# Patient Record
Sex: Female | Born: 2000 | Hispanic: No | Marital: Single | Smoking: Never smoker
Health system: Southern US, Community
[De-identification: ages and names within clinical notes are randomized; demographics above are authoritative.]

## PROBLEM LIST (undated history)

## (undated) DIAGNOSIS — R454 Irritability and anger: Secondary | ICD-10-CM

---
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
## 2022-08-05 ENCOUNTER — Emergency Department (HOSPITAL_COMMUNITY): Payer: Medicaid Other

## 2022-08-05 ENCOUNTER — Emergency Department (HOSPITAL_COMMUNITY)
Admission: EM | Admit: 2022-08-05 | Discharge: 2022-08-05 | Disposition: A | Discharge status: Home / Self Care | Payer: Medicaid Other | Attending: Emergency Medicine | Admitting: Emergency Medicine

## 2022-08-05 ENCOUNTER — Encounter (HOSPITAL_COMMUNITY): Payer: Self-pay | Admitting: Emergency Medicine

## 2022-08-05 ENCOUNTER — Other Ambulatory Visit: Payer: Self-pay

## 2022-08-05 DIAGNOSIS — S060X9A Concussion with loss of consciousness of unspecified duration, initial encounter: Secondary | ICD-10-CM

## 2022-08-05 DIAGNOSIS — S50811A Abrasion of right forearm, initial encounter: Secondary | ICD-10-CM | POA: Diagnosis not present

## 2022-08-05 DIAGNOSIS — Y9229 Other specified public building as the place of occurrence of the external cause: Secondary | ICD-10-CM | POA: Insufficient documentation

## 2022-08-05 DIAGNOSIS — S060XAA Concussion with loss of consciousness status unknown, initial encounter: Secondary | ICD-10-CM | POA: Diagnosis not present

## 2022-08-05 DIAGNOSIS — S0990XA Unspecified injury of head, initial encounter: Secondary | ICD-10-CM | POA: Diagnosis present

## 2022-08-05 DIAGNOSIS — S61411A Laceration without foreign body of right hand, initial encounter: Secondary | ICD-10-CM | POA: Diagnosis not present

## 2022-08-05 DIAGNOSIS — M546 Pain in thoracic spine: Secondary | ICD-10-CM | POA: Insufficient documentation

## 2022-08-05 DIAGNOSIS — M545 Low back pain, unspecified: Secondary | ICD-10-CM | POA: Insufficient documentation

## 2022-08-05 MED ORDER — CEPHALEXIN 500 MG PO CAPS: 500.0000 mg | ORAL_CAPSULE | Freq: Four times a day (QID) | ORAL | 0 refills | Status: AC

## 2022-08-05 MED ORDER — LIDOCAINE HCL (PF) 1 % IJ SOLN
5.0000 mL | Freq: Once | INTRAMUSCULAR | Status: AC
  Administered 2022-08-05: 5 mL
  Filled 2022-08-05: qty 5
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
## 2022-08-05 NOTE — ED Notes (Signed)
Patient verbalizes understanding of discharge instructions. Opportunity for questioning and answers were provided. Armband removed by staff, pt discharged from ED. Pt ambulatory to ED waiting room with steady gait.  

## 2022-08-05 NOTE — Discharge Instructions (Addendum)
Your work-up today was overall reassuring.  X-ray of your hand did not show any fractures.  CT scan of the head did not show any concerning findings.  Given the mechanism of your injury you most likely have a concussion.  I have attached information regarding concussion and what to expect.  Mild nausea, headache is normal.  Significant episodes of throwing up, severe headache, or significant somnolence is concerning.  If you notice any concerning signs please return for evaluation otherwise have given you information for concussion clinic listed above.  Have also sent antibiotic in for the laceration to your right hand.  If you notice any signs of infection such as fever, significant redness or drainage surrounding the wound please return for evaluation.  The stitch will need to be removed in about 7 days.  You could follow-up with your primary care provider, go to an urgent care or return to the emergency room to have this taken out.

## 2023-01-04 ENCOUNTER — Inpatient Hospital Stay (HOSPITAL_COMMUNITY)
Admission: EM | Admit: 2023-01-04 | Discharge: 2023-01-06 | DRG: 641 | Disposition: A | Discharge status: Home / Self Care | Payer: Self-pay | Attending: Internal Medicine | Admitting: Internal Medicine

## 2023-01-04 ENCOUNTER — Other Ambulatory Visit: Payer: Self-pay

## 2023-01-04 ENCOUNTER — Emergency Department (HOSPITAL_COMMUNITY): Payer: Medicaid Other

## 2023-01-04 DIAGNOSIS — E669 Obesity, unspecified: Secondary | ICD-10-CM | POA: Diagnosis present

## 2023-01-04 DIAGNOSIS — M659 Synovitis and tenosynovitis, unspecified: Secondary | ICD-10-CM | POA: Diagnosis present

## 2023-01-04 DIAGNOSIS — F17210 Nicotine dependence, cigarettes, uncomplicated: Secondary | ICD-10-CM | POA: Diagnosis present

## 2023-01-04 DIAGNOSIS — S61251A Open bite of left index finger without damage to nail, initial encounter: Secondary | ICD-10-CM | POA: Diagnosis present

## 2023-01-04 DIAGNOSIS — E876 Hypokalemia: Principal | ICD-10-CM | POA: Diagnosis not present

## 2023-01-04 DIAGNOSIS — S61452A Open bite of left hand, initial encounter: Secondary | ICD-10-CM | POA: Diagnosis present

## 2023-01-04 DIAGNOSIS — S61459A Open bite of unspecified hand, initial encounter: Secondary | ICD-10-CM | POA: Diagnosis present

## 2023-01-04 DIAGNOSIS — Z23 Encounter for immunization: Secondary | ICD-10-CM

## 2023-01-04 DIAGNOSIS — W5501XA Bitten by cat, initial encounter: Secondary | ICD-10-CM | POA: Diagnosis present

## 2023-01-04 DIAGNOSIS — Z6830 Body mass index (BMI) 30.0-30.9, adult: Secondary | ICD-10-CM

## 2023-01-04 DIAGNOSIS — T148XXA Other injury of unspecified body region, initial encounter: Secondary | ICD-10-CM

## 2023-01-04 DIAGNOSIS — T368X5A Adverse effect of other systemic antibiotics, initial encounter: Secondary | ICD-10-CM | POA: Diagnosis present

## 2023-01-04 DIAGNOSIS — L03114 Cellulitis of left upper limb: Secondary | ICD-10-CM | POA: Diagnosis present

## 2023-01-04 LAB — CBC WITH DIFFERENTIAL/PLATELET
Abs Immature Granulocytes: 0.01 10*3/uL (ref 0.00–0.07)
Basophils Absolute: 0.1 10*3/uL (ref 0.0–0.1)
Basophils Relative: 1 %
Eosinophils Absolute: 0 10*3/uL (ref 0.0–0.5)
Eosinophils Relative: 1 %
HCT: 43.7 % (ref 36.0–46.0)
Hemoglobin: 15.3 g/dL — ABNORMAL HIGH (ref 12.0–15.0)
Immature Granulocytes: 0 %
Lymphocytes Relative: 23 %
Lymphs Abs: 1.6 10*3/uL (ref 0.7–4.0)
MCH: 31.4 pg (ref 26.0–34.0)
MCHC: 35 g/dL (ref 30.0–36.0)
MCV: 89.7 fL (ref 80.0–100.0)
Monocytes Absolute: 0.4 10*3/uL (ref 0.1–1.0)
Monocytes Relative: 6 %
Neutro Abs: 5 10*3/uL (ref 1.7–7.7)
Neutrophils Relative %: 69 %
Platelets: 293 10*3/uL (ref 150–400)
RBC: 4.87 MIL/uL (ref 3.87–5.11)
RDW: 12.2 % (ref 11.5–15.5)
WBC: 7.1 10*3/uL (ref 4.0–10.5)
nRBC: 0 % (ref 0.0–0.2)

## 2023-01-04 LAB — BASIC METABOLIC PANEL
Anion gap: 11 (ref 5–15)
BUN: 7 mg/dL (ref 6–20)
CO2: 20 mmol/L — ABNORMAL LOW (ref 22–32)
Calcium: 9.3 mg/dL (ref 8.9–10.3)
Chloride: 106 mmol/L (ref 98–111)
Creatinine, Ser: 0.63 mg/dL (ref 0.44–1.00)
GFR, Estimated: >60 mL/min (ref >60)
Glucose, Bld: 98 mg/dL (ref 70–99)
Potassium: 3.8 mmol/L (ref 3.5–5.1)
Sodium: 137 mmol/L (ref 135–145)

## 2023-01-04 LAB — SEDIMENTATION RATE: Sed Rate: 7 mm/hr (ref 0–22)

## 2023-01-04 MED ORDER — ACETAMINOPHEN 325 MG PO TABS
650.0000 mg | ORAL_TABLET | Freq: Four times a day (QID) | ORAL | Status: DC
  Administered 2023-01-05 – 2023-01-06 (×2): 650 mg via ORAL
  Filled 2023-01-04 (×2): qty 2

## 2023-01-04 MED ORDER — DIPHENHYDRAMINE HCL 50 MG/ML IJ SOLN
25.0000 mg | Freq: Once | INTRAMUSCULAR | Status: AC
  Administered 2023-01-04: 25 mg via INTRAVENOUS
  Filled 2023-01-04: qty 1

## 2023-01-04 MED ORDER — MORPHINE SULFATE (PF) 4 MG/ML IV SOLN
4.0000 mg | Freq: Once | INTRAVENOUS | Status: AC
  Administered 2023-01-04: 4 mg via INTRAVENOUS
  Filled 2023-01-04: qty 1

## 2023-01-04 MED ORDER — VANCOMYCIN HCL 750 MG/150ML IV SOLN: 750.0000 mg | Freq: Two times a day (BID) | INTRAVENOUS | Status: DC

## 2023-01-04 MED ORDER — VANCOMYCIN HCL 1250 MG/250ML IV SOLN
1250.0000 mg | Freq: Once | INTRAVENOUS | Status: AC
  Administered 2023-01-04: 1250 mg via INTRAVENOUS
  Filled 2023-01-04: qty 250

## 2023-01-04 MED ORDER — LIDOCAINE HCL (PF) 1 % IJ SOLN
10.0000 mL | Freq: Once | INTRAMUSCULAR | Status: AC
  Administered 2023-01-04: 10 mL
  Filled 2023-01-04: qty 10

## 2023-01-04 MED ORDER — TETANUS-DIPHTH-ACELL PERTUSSIS 5-2.5-18.5 LF-MCG/0.5 IM SUSY
0.5000 mL | PREFILLED_SYRINGE | Freq: Once | INTRAMUSCULAR | Status: AC
  Administered 2023-01-04: 0.5 mL via INTRAMUSCULAR
  Filled 2023-01-04: qty 0.5

## 2023-01-04 MED ORDER — MORPHINE SULFATE (PF) 2 MG/ML IV SOLN: 2.0000 mg | INTRAVENOUS | Status: DC | PRN

## 2023-01-04 MED ORDER — ONDANSETRON HCL 4 MG/2ML IJ SOLN: 4.0000 mg | Freq: Four times a day (QID) | INTRAMUSCULAR | Status: DC | PRN

## 2023-01-04 MED ORDER — ONDANSETRON HCL 4 MG/2ML IJ SOLN
4.0000 mg | Freq: Once | INTRAMUSCULAR | Status: AC
  Administered 2023-01-04: 4 mg via INTRAVENOUS
  Filled 2023-01-04: qty 2

## 2023-01-04 MED ORDER — HYDROCODONE-ACETAMINOPHEN 5-325 MG PO TABS: 2.0000 | ORAL_TABLET | Freq: Once | ORAL | Status: DC

## 2023-01-04 MED ORDER — SODIUM CHLORIDE 0.9 % IV SOLN
3.0000 g | Freq: Four times a day (QID) | INTRAVENOUS | Status: DC
  Administered 2023-01-05 – 2023-01-06 (×5): 3 g via INTRAVENOUS
  Filled 2023-01-04 (×5): qty 8

## 2023-01-04 MED ORDER — HYDROCODONE-ACETAMINOPHEN 5-325 MG PO TABS
1.0000 | ORAL_TABLET | ORAL | Status: DC | PRN
  Administered 2023-01-04: 2 via ORAL
  Administered 2023-01-05 (×2): 1 via ORAL
  Filled 2023-01-04 (×2): qty 1
  Filled 2023-01-04: qty 2
  Filled 2023-01-04: qty 1

## 2023-01-04 MED ORDER — IBUPROFEN 200 MG PO TABS
600.0000 mg | ORAL_TABLET | Freq: Four times a day (QID) | ORAL | Status: DC
  Administered 2023-01-05 – 2023-01-06 (×2): 600 mg via ORAL
  Filled 2023-01-04 (×2): qty 3

## 2023-01-04 MED ORDER — ONDANSETRON HCL 4 MG PO TABS: 4.0000 mg | ORAL_TABLET | Freq: Four times a day (QID) | ORAL | Status: DC | PRN

## 2023-01-04 MED ORDER — PIPERACILLIN-TAZOBACTAM 3.375 G IVPB 30 MIN
3.3750 g | Freq: Once | INTRAVENOUS | Status: AC
  Administered 2023-01-04: 3.375 g via INTRAVENOUS
  Filled 2023-01-04: qty 50

## 2023-01-04 MED ORDER — ONDANSETRON 4 MG PO TBDP
8.0000 mg | ORAL_TABLET | Freq: Once | ORAL | Status: DC
  Filled 2023-01-04: qty 2

## 2023-01-04 MED ORDER — PIPERACILLIN-TAZOBACTAM 3.375 G IVPB: 3.3750 g | Freq: Three times a day (TID) | INTRAVENOUS | Status: DC

## 2023-01-04 NOTE — H&P (Signed)
History and Physical    Patient: Lindsay Hickman WUJ:[id number] DOB: 05/19/2001 DOA: 01/04/2023 DOS: the patient was seen and examined on 01/04/2023 PCP: Patient, No Pcp Per  Patient coming from: Home - lives with her partner and brother.    Chief Complaint: cat bite   HPI: Lindsay Hickman is a 22 y.o. female with no significant medical history presenting with cat bite. She got bit by her cat last night. She cleaned it out and put  neosporin on it. She states she had a hard time sleeping due to the discomfort.  This morning when she woke up it was slightly swollen and she couldn't really open or close her hand, but went to work.  As the day progressed she had more swelling, pain and increasing redness. She also had some pus like drainage. She states the pain was so bad she came to ED.   These are her cats. The cat was 9 months, has not had rabies. Never has left the house. She is unsure about tdap, not any history in chart.    Denies any fever/chills, vision changes/headaches, chest pain or palpitations, shortness of breath or cough, abdominal pain, N/V/D, dysuria or leg swelling.    She does smoke black and mild occasionally and does not drink alcohol.   ER Course:  vitals: afebrile, bp: 139/98, HR; 85, RR: 17, oxygen: 95%RA Pertinent labs: none Left hand xray: no fracture or radiopaque foreign body  In ED: ortho consulted. Asked EDP to do I&D at bedside and will re-evaluate tomorrow. TRH to admit.    Review of Systems: As mentioned in the history of present illness. All other systems reviewed and are negative. Past Medical History:  Diagnosis Date   Outbursts of anger    No past surgical history on file. Social History:  reports that she has never smoked. She does not have any smokeless tobacco history on file. She reports current drug use. Drug: Marijuana. She reports that she does not drink alcohol.  No Known Allergies  No family history on  file.  Prior to Admission medications   Medication Sig Start Date End Date Taking? Authorizing Provider  ibuprofen (ADVIL,MOTRIN) 200 MG tablet Take 200-400 mg by mouth every 4 (four) hours as needed for fever.    [provider]    Physical Exam: Vitals:   01/04/23 1825 01/04/23 1945 01/04/23 2100 01/04/23 2246  BP: (!) 128/96 108/65 114/67   Pulse: 87 80 65   Resp: 17  17   Temp: 98 F (36.7 C)   98.4 F (36.9 C)  TempSrc:    Oral  SpO2: 100% 100% 100%    General:  Appears calm and comfortable and is in NAD Eyes:  PERRL, EOMI, normal lids, iris ENT:  grossly normal hearing, lips & tongue, mmm; appropriate dentition Neck:  no LAD, masses or thyromegaly; no carotid bruits Cardiovascular:  RRR, no m/r/g. No LE edema.  Respiratory:   CTA bilaterally with no wheezes/rales/rhonchi.  Normal respiratory effort. Abdomen:  soft, NT, ND, NABS Back:   normal alignment, no CVAT Skin:  no rash or induration seen on limited exam Musculoskeletal:  grossly normal tone BUE/BLE, good ROM, no bony abnormality. Left hand: puncture wound to 2nd digit with surrounding erythema and edema. Can not close hand. +flexion/extension against resistance of finger.  Can fully extend finger. More pain to palpation in thumb. No fluctuance. Radial pulse intact. Clear drainage from puncture wounds, but nothing expressed, no fluctuance.  Lower  extremity:  No LE edema.  Limited foot exam with no ulcerations.  2+ distal pulses. Psychiatric:  grossly normal mood and affect, speech fluent and appropriate, AOx3 Neurologic:  CN 2-12 grossly intact, moves all extremities in coordinated fashion, sensation intact   Radiological Exams on Admission: Independently reviewed - see discussion in A/P where applicable  DG Hand Complete Left  Result Date: 01/04/2023 CLINICAL DATA:  Animal bite EXAM: LEFT HAND - COMPLETE 3+ VIEW COMPARISON:  None Available. FINDINGS: Frontal, oblique, and lateral views of the left hand are  obtained. No fracture, subluxation, or dislocation. Joint spaces are well preserved. Soft tissues are unremarkable. No radiopaque foreign body. IMPRESSION: 1. No fracture or radiopaque foreign body. Electronically Signed   By: Randa Ngo M.D.   On: 01/04/2023 19:57     Labs on Admission: I have personally reviewed the available labs and imaging studies at the time of the admission.  Pertinent labs:   None   Assessment and Plan: Principal Problem:   Cat bite of left hand    Assessment and Plan: * Cat bite of left hand 29 year old right handed patient with history of cat bite to left 2nd digit with worsening swelling, pain and redness -obs to med-surg -hand surgery consulted who recommended medical management for now and will re assess in the morning for possible early abscess formation that may require OR.  -I&D done by EdP per ortho request  -no leukocytosis/sepsis criteria. Normal ESR  -NPO at midnight -was on vanc/zosyn in ED and had reaction to vancomycin. Resolved with benadryl. With cat bite will change to unasyn -pain medication with norco and morphine  -tdap given -cat is 9 months and has never left the house, but does not have rabies vaccine. Discussed this with patient and need to quarantine cat. Will need to f/u on this and make sure it is done.      Advance Care Planning:   Code Status: Full Code   Consults: hand surgery: Dr. Grandville Silos   DVT Prophylaxis: ambulation   Family Communication: none   Severity of Illness: The appropriate patient status for this patient is OBSERVATION. Observation status is judged to be reasonable and necessary in order to provide the required intensity of service to ensure the patient's safety. The patient's presenting symptoms, physical exam findings, and initial radiographic and laboratory data in the context of their medical condition is felt to place them at decreased risk for further clinical deterioration. Furthermore, it is  anticipated that the patient will be medically stable for discharge from the hospital within 2 midnights of admission.   Author: Orma Flaming, MD 01/04/2023 11:12 PM  For on call review [email or web address].

## 2023-01-04 NOTE — Progress Notes (Signed)
Contacted regarding this patient with developing finger infection following cat bite.  Reported to be afebrile, no WBC yet available.  Verbalized concern was for suppurative flexor tenosynovitis.  Recommended that due to verbalized concern, patient should be admitted for treatment for parenteral antibiotics and observation for response to treatment.  Please keep NPO and avoid pharmacological anticoagulation until fully determined  not to require invasive treatment.  I will plan to evaluate her to help determine if supplemental surgical drainage will be helpful as an adjunct to her definitive medical treatment.

## 2023-01-04 NOTE — ED Notes (Signed)
ED TO INPATIENT HANDOFF REPORT  ED Nurse Name and Phone #: [phone]  S Name/Age/Gender Lindsay Hickman 22 y.o. female Room/Bed: 035C/035C  Code Status   Code Status: Prior  Home/SNF/Other Home Patient oriented to: self, place, time, and situation Is this baseline? Yes   Triage Complete: Triage complete  Chief Complaint Cat bite of left hand [S61.452A, W55.01XA]  Triage Note Patient here for evaluation of bilateral hand injuries after she was bitten by her girlfriend's cat yesterday which she reports she believes is up to date on vaccinations. Patient complains of painful swelling on left hand.    Allergies No Known Allergies  Level of Care/Admitting Diagnosis ED Disposition     ED Disposition  Admit   Condition  --   Comment  Hospital Area: [facility] [[id number]]  Level of Care: Med-Surg [16]  May place patient in observation at [facility] or [facility] if equivalent level of care is available:: No  Covid Evaluation: Asymptomatic - no recent exposure (last 10 days) testing not required  Diagnosis: Cat bite of left hand [[id number]]  Admitting Physician: Orma Flaming [[id number]]  Attending Physician: Orma Flaming [[id number]]          B Medical/Surgery History Past Medical History:  Diagnosis Date   Outbursts of anger    No past surgical history on file.   A IV Location/Drains/Wounds Patient Lines/Drains/Airways Status     Active Line/Drains/Airways     Name Placement date Placement time Site Days   Peripheral IV 01/04/23 20 G Right Antecubital 01/04/23  2003  Antecubital  less than 1            Intake/Output Last 24 hours  Intake/Output Summary (Last 24 hours) at 01/04/2023 2244 Last data filed at 01/04/2023 2143 Gross per 24 hour  Intake 258.67 ml  Output --  Net 258.67 ml    Labs/Imaging Results for orders placed or performed during the hospital encounter of 01/04/23 (from the past 48 hour(s))  CBC  with Differential     Status: Abnormal   Collection Time: 01/04/23  7:57 PM  Result Value Ref Range   WBC 7.1 4.0 - 10.5 K/uL   RBC 4.87 3.87 - 5.11 MIL/uL   Hemoglobin 15.3 (H) 12.0 - 15.0 g/dL   HCT 43.7 36.0 - 46.0 %   MCV 89.7 80.0 - 100.0 fL   MCH 31.4 26.0 - 34.0 pg   MCHC 35.0 30.0 - 36.0 g/dL   RDW 12.2 11.5 - 15.5 %   Platelets 293 150 - 400 K/uL   nRBC 0.0 0.0 - 0.2 %   Neutrophils Relative % 69 %   Neutro Abs 5.0 1.7 - 7.7 K/uL   Lymphocytes Relative 23 %   Lymphs Abs 1.6 0.7 - 4.0 K/uL   Monocytes Relative 6 %   Monocytes Absolute 0.4 0.1 - 1.0 K/uL   Eosinophils Relative 1 %   Eosinophils Absolute 0.0 0.0 - 0.5 K/uL   Basophils Relative 1 %   Basophils Absolute 0.1 0.0 - 0.1 K/uL   Immature Granulocytes 0 %   Abs Immature Granulocytes 0.01 0.00 - 0.07 K/uL    Comment: Performed at [facility], [address], [place], [place] [address]  Basic metabolic panel     Status: Abnormal   Collection Time: 01/04/23  7:57 PM  Result Value Ref Range   Sodium 137 135 - 145 mmol/L   Potassium 3.8 3.5 - 5.1 mmol/L   Chloride 106 98 -  111 mmol/L   CO2 20 (L) 22 - 32 mmol/L   Glucose, Bld 98 70 - 99 mg/dL    Comment: Glucose reference range applies only to samples taken after fasting for at least 8 hours.   BUN 7 6 - 20 mg/dL   Creatinine, Ser 0.63 0.44 - 1.00 mg/dL   Calcium 9.3 8.9 - 10.3 mg/dL   GFR, Estimated >60 >60 mL/min    Comment: (NOTE) Calculated using the CKD-EPI Creatinine Equation (2021)    Anion gap 11 5 - 15    Comment: Performed at [facility] [address] [place], [place] [address]  Sedimentation rate     Status: None   Collection Time: 01/04/23  7:57 PM  Result Value Ref Range   Sed Rate 7 0 - 22 mm/hr    Comment: Performed at [facility], [place] [address], [place], [place] [address]   DG Hand Complete Left  Result Date: 01/04/2023 CLINICAL DATA:  Animal bite EXAM: LEFT HAND - COMPLETE 3+ VIEW COMPARISON:  None Available.  FINDINGS: Frontal, oblique, and lateral views of the left hand are obtained. No fracture, subluxation, or dislocation. Joint spaces are well preserved. Soft tissues are unremarkable. No radiopaque foreign body. IMPRESSION: 1. No fracture or radiopaque foreign body. Electronically Signed   By: Randa Ngo M.D.   On: 01/04/2023 19:57    Pending Labs Unresulted Labs (From admission, onward)     Start     Ordered   01/04/23 2232  Rapid urine drug screen (hospital performed)  ONCE - STAT,   STAT        01/04/23 2231            Vitals/Pain Today's Vitals   01/04/23 1744 01/04/23 1825 01/04/23 1945 01/04/23 2100  BP:  (!) 128/96 108/65 114/67  Pulse:  87 80 65  Resp:  17  17  Temp:  98 F (36.7 C)    SpO2:  100% 100% 100%  PainSc: 10-Worst pain ever       Isolation Precautions No active isolations  Medications Medications  ibuprofen (ADVIL) tablet 600 mg (has no administration in time range)  acetaminophen (TYLENOL) tablet 650 mg (has no administration in time range)  Ampicillin-Sulbactam (UNASYN) 3 g in sodium chloride 0.9 % 100 mL IVPB (has no administration in time range)  piperacillin-tazobactam (ZOSYN) IVPB 3.375 g (0 g Intravenous Stopped 01/04/23 2025)  vancomycin (VANCOREADY) IVPB 1250 mg/250 mL (0 mg Intravenous Stopped 01/04/23 2143)  ondansetron (ZOFRAN) injection 4 mg (4 mg Intravenous Given 01/04/23 2004)  morphine (PF) 4 MG/ML injection 4 mg (4 mg Intravenous Given 01/04/23 2003)  lidocaine (PF) (XYLOCAINE) 1 % injection 10 mL (10 mLs Infiltration Given by Other 01/04/23 2206)  diphenhydrAMINE (BENADRYL) injection 25 mg (25 mg Intravenous Given 01/04/23 2150)    Mobility walks     Focused Assessments cardiac   R Recommendations: See Admitting Provider Note  Report given to:   Additional Notes: girlfriend with patient

## 2023-01-04 NOTE — ED Provider Notes (Cosign Needed Addendum)
[facility] Provider Note   CSN: [id number] Arrival date & time: 01/04/23  1713     History  Chief Complaint  Patient presents with   Animal Bite    Lindsay Hickman is a 22 y.o. female.  22 year old female presents today for evaluation following cat bite that occurred yesterday.  She has 2 cat bites over the left index finger, multiple scratches to the right forearm.  She states this morning she woke up with significant swelling, redness to her left index finger that progressively worsened throughout the day.  Patient is right-hand dominant.  Denies fever.  She states her cats broke out into a fight that she tried to break up.  After she broke up the fight the 8-month old cat attacked the patient.  According the patient the cat was up-to-date on its vaccinations.  Patient's partner is at bedside.  She corroborates her story.  Patient recently had a visit for lack repair.  At that visit patient stated her tetanus was up-to-date.  However today when attempting to look through her chart for the date of her last tetanus shot as she states she does not recall when her last tetanus was I was unable to locate this.  Ultimately this was ordered by the hospitalist.    The history is provided by the patient. No language interpreter was used.       Home Medications Prior to Admission medications   Medication Sig Start Date End Date Taking? Authorizing Provider  ibuprofen (ADVIL,MOTRIN) 200 MG tablet Take 200-400 mg by mouth every 4 (four) hours as needed for fever.    [provider]      Allergies    Patient has no known allergies.    Review of Systems   Review of Systems  Constitutional:  Negative for fever.  Musculoskeletal:  Positive for arthralgias.  All other systems reviewed and are negative.   Physical Exam Updated Vital Signs BP (!) 128/96   Pulse 87   Temp 98 F (36.7 C)   Resp 17   SpO2 100%   Physical Exam Vitals and nursing note reviewed.  Constitutional:      General: She is not in acute distress.    Appearance: Normal appearance. She is not ill-appearing.  HENT:     Head: Normocephalic and atraumatic.     Nose: Nose normal.  Eyes:     General: No scleral icterus.    Extraocular Movements: Extraocular movements intact.     Conjunctiva/sclera: Conjunctivae normal.  Cardiovascular:     Rate and Rhythm: Normal rate and regular rhythm.     Pulses: Normal pulses.  Pulmonary:     Effort: Pulmonary effort is normal. No respiratory distress.     Breath sounds: Normal breath sounds. No wheezing or rales.  Abdominal:     General: There is no distension.     Palpations: Abdomen is soft.     Tenderness: There is no abdominal tenderness.  Musculoskeletal:        General: Normal range of motion.     Cervical back: Normal range of motion.     Comments: Multiple bite marks noted to left index finger.  Surrounded by erythema and swelling.  Patient is keeping the left index finger in flexion.  Unable to extend.  Pain with passive extension.  Tenderness to palpation present along the flexor tendon sheath.  Neurovascularly intact.  Brisk cap refill.  Skin:    General: Skin  is warm and dry.     Comments: 2 bite bite mark noted to left index finger.  1 on the dorsal aspect the other on the palmar aspect.  Scratches noted to right forearm.  Neurological:     General: No focal deficit present.     Mental Status: She is alert. Mental status is at baseline.       ED Results / Procedures / Treatments   Labs (all labs ordered are listed, but only abnormal results are displayed) Labs Reviewed - No data to display  EKG None  Radiology No results found.  Procedures .Nerve Block  Date/Time: 01/05/2023 12:01 AM  Performed by: Evlyn Courier, PA-C Authorized by: Evlyn Courier, PA-C   Consent:    Consent obtained:  Verbal   Consent given by:  Patient   Risks discussed:  Swelling, pain,  nerve damage, unsuccessful block and infection   Alternatives discussed:  No treatment, delayed treatment and alternative treatment Universal protocol:    Procedure explained and questions answered to patient or proxy's satisfaction: yes     Relevant documents present and verified: yes     Test results available: yes     Imaging studies available: yes     Patient identity confirmed:  Verbally with patient and arm band Indications:    Indications:  Pain relief and procedural anesthesia Location:    Body area:  Upper extremity   Upper extremity nerve:  Metacarpal   Laterality:  Left Pre-procedure details:    Skin preparation:  Alcohol Skin anesthesia:    Skin anesthesia method:  None Procedure details:    Block needle gauge:  27 G Post-procedure details:    Outcome:  Anesthesia achieved   Procedure completion:  Tolerated well, no immediate complications     Medications Ordered in ED Medications - No data to display  ED Course/ Medical Decision Making/ A&P Clinical Course as of 01/04/23 2128  Fri Jan 04, 2023  1830 Evaluated at bedside.  Bit by cat yesterday.  Now cannot bend finger first digit left hand.  No other medical problems.  Cannot tolerate passive extension held in slight flexion, swelling over volar surface [CC]  1951 CBC with Differential [AA]    Clinical Course User Index [AA] Evlyn Courier, PA-C [CC] Tretha Sciara, MD                             Medical Decision Making Amount and/or Complexity of Data Reviewed Labs: ordered. Decision-making details documented in ED Course. Radiology: ordered.  Risk Prescription drug management. Decision regarding hospitalization.   Medical Decision Making / ED Course   This patient presents to the ED for concern of animal bite of the hand, this involves an extensive number of treatment options, and is a complaint that carries with it a high risk of complications and morbidity.  The differential diagnosis includes  cellulitis, flexor tenosynovitis  MDM: 22 year old female presents today for evaluation following a cat bite that occurred yesterday.  Since then she has had worsening redness, swelling, and pain.  Positive [place] signs on exam.  Images placed under media tab.  Antibiotics ordered, pain medication ordered.  Discussed with hand surgeon who will evaluate patient.  However per the images above he does not feel patient needs to be taken the OR tonight.  He recommends IV antibiotics and admission to medicine.  Following evaluated patient at bedside and surgery recommends bedside incision and drainage surrounding the bite  marks.  These were done by my attending.  Please see his procedure note regarding this.  Case discussed with hospitalist who will evaluate patient for admission. Patient states the cat is at her partner's apartment.  Discussion had regarding rabies vaccinations as patient's cat is 14 months old and is not up-to-date on vaccinations.  However this was a mild spreading of their previous cat and has been domesticated since then.  No suspicion for rabies exposure.  Shared decision making had regarding continuing with rabies vaccinations versus quarantining the cat and patient prefers to quarantine the cat with animal control. Patient is right-hand dominant.  Lab Tests: -I ordered, reviewed, and interpreted labs.   The pertinent results include:   Labs Reviewed  CBC WITH DIFFERENTIAL/PLATELET - Abnormal; Notable for the following components:      Result Value   Hemoglobin 15.3 (*)    All other components within normal limits  BASIC METABOLIC PANEL - Abnormal; Notable for the following components:   CO2 20 (*)    All other components within normal limits  SEDIMENTATION RATE  RAPID URINE DRUG SCREEN, HOSP PERFORMED      EKG  EKG Interpretation  Date/Time:    Ventricular Rate:    PR Interval:    QRS Duration:   QT Interval:    QTC Calculation:   R Axis:     Text  Interpretation:           Imaging Studies ordered: I ordered imaging studies including left hand x-ray I independently visualized and interpreted imaging. I agree with the radiologist interpretation   Medicines ordered and prescription drug management: Meds ordered this encounter  Medications   DISCONTD: HYDROcodone-acetaminophen (NORCO/VICODIN) 5-325 MG per tablet 2 tablet   DISCONTD: ondansetron (ZOFRAN-ODT) disintegrating tablet 8 mg   piperacillin-tazobactam (ZOSYN) IVPB 3.375 g    Order Specific Question:   Antibiotic Indication:    Answer:   Other Indication (list below)    Order Specific Question:   Other Indication:    Answer:   tenosynovitis   vancomycin (VANCOREADY) IVPB 1250 mg/250 mL    Order Specific Question:   Indication:    Answer:   Other Indication (list below)    Order Specific Question:   Other Indication:    Answer:   tenosynovitis   ondansetron (ZOFRAN) injection 4 mg   morphine (PF) 4 MG/ML injection 4 mg   DISCONTD: vancomycin (VANCOREADY) IVPB 750 mg/150 mL    Order Specific Question:   Indication:    Answer:   Other Indication (list below)    Order Specific Question:   Other Indication:    Answer:   tenosynovitis   DISCONTD: piperacillin-tazobactam (ZOSYN) IVPB 3.375 g    Order Specific Question:   Antibiotic Indication:    Answer:   Other Indication (list below)    Order Specific Question:   Other Indication:    Answer:   tenosynovitis   lidocaine (PF) (XYLOCAINE) 1 % injection 10 mL   ibuprofen (ADVIL) tablet 600 mg   acetaminophen (TYLENOL) tablet 650 mg   diphenhydrAMINE (BENADRYL) injection 25 mg   Ampicillin-Sulbactam (UNASYN) 3 g in sodium chloride 0.9 % 100 mL IVPB    Order Specific Question:   Antibiotic Indication:    Answer:   Wound Infection    -I have reviewed the patients home medicines and have made adjustments as needed  Critical interventions Pain control. IV abx  Consultations Obtained: I requested consultation with the  hand surgery (Dr. Grandville Silos),  and  discussed lab and imaging findings as well as pertinent plan - they recommend: as above  Reevaluation: After the interventions noted above, I reevaluated the patient and found that they have :stayed the same  Co morbidities that complicate the patient evaluation  Past Medical History:  Diagnosis Date   Outbursts of anger       Dispostion: Patient discussed with hospitalist will evaluate patient for admission.  Final Clinical Impression(s) / ED Diagnoses Final diagnoses:  Animal bite  Flexor tenosynovitis of finger    Rx / DC Orders ED Discharge Orders     None         Marita Kansas, PA-C 01/04/23 2312    Marita Kansas, PA-C 01/05/23 0002    Glyn Ade, MD 01/05/23 [phone]

## 2023-01-04 NOTE — Assessment & Plan Note (Addendum)
22 year old right handed patient with history of cat bite to left 2nd digit with worsening swelling, pain and redness -obs to med-surg -hand surgery consulted who recommended medical management for now and will re assess in the morning for possible early abscess formation that may require OR.  -I&D done by EdP per ortho request  -no leukocytosis/sepsis criteria. Normal ESR  -NPO at midnight -was on vanc/zosyn in ED and had reaction to vancomycin. Resolved with benadryl. With cat bite will change to unasyn -pain medication with norco and morphine  -tdap given -cat is 9 months and has never left the house, but does not have rabies vaccine. Discussed this with patient and need to quarantine cat. Will need to f/u on this and make sure it is done.

## 2023-01-04 NOTE — ED Notes (Signed)
Called into room for patient having reaction to vancomycin. Patient has some redness and itching. No trouble breathing. Vancomycin stopped, [place], MD notified and benadryl ordered and given at this time. Patient in NAD.

## 2023-01-04 NOTE — ED Triage Notes (Signed)
Patient here for evaluation of bilateral hand injuries after she was bitten by her girlfriend's cat yesterday which she reports she believes is up to date on vaccinations. Patient complains of painful swelling on left hand.

## 2023-01-04 NOTE — Progress Notes (Signed)
Pharmacy Antibiotic Note  Lindsay Hickman is a 22 y.o. female admitted on 01/04/2023 presenting with infected cat bite, concern for tenosynovitis.  Pharmacy has been consulted for vancomycin and zosyn dosing.  Plan: Vancomycin 1250 mg IV x 1, then 750 mg IV q 12h (eAUC 448) Zosyn 3.375g IV every 8 hours (extended 4h infusion) Monitor renal function, Cx, ortho recs and clinical progression Vancomycin levels as indicated     Temp (24hrs), Avg:98.1 F (36.7 C), Min:98 F (36.7 C), Max:98.2 F (36.8 C)  Recent Labs  Lab 01/04/23 1957  WBC 7.1  CREATININE 0.63    CrCl cannot be calculated (Unknown ideal weight.).    No Known Allergies  Bertis Ruddy, PharmD, [facility] Clinical Pharmacist ED Pharmacist Phone # [phone] 01/04/2023 9:06 PM

## 2023-01-04 NOTE — ED Notes (Signed)
Patient taken to inpatient room with ED tech in NAD at time of transfer.

## 2023-01-04 NOTE — Consult Note (Addendum)
ORTHOPAEDIC CONSULTATION HISTORY & PHYSICAL REQUESTING PHYSICIAN: Tretha Sciara, MD  Chief Complaint: Left index finger cat bite wound  HPI: Lindsay Hickman is a 22 y.o. female who reports being bitten about the proximal phalanx of the left index finger last night.  She reports that it hurt quite a bit when it happened and required special positioning through the night to allow for sleep.  Past Medical History:  Diagnosis Date   Outbursts of anger    No past surgical history on file. Social History   Socioeconomic History   Marital status: Single    Spouse name: Not on file   Number of children: Not on file   Years of education: Not on file   Highest education level: Not on file  Occupational History   Not on file  Tobacco Use   Smoking status: Never   Smokeless tobacco: Not on file  Substance and Sexual Activity   Alcohol use: No   Drug use: Yes    Types: Marijuana   Sexual activity: Not on file  Other Topics Concern   Not on file  Social History Narrative   Not on file   Social Determinants of Health   Financial Resource Strain: Not on file  Food Insecurity: Not on file  Transportation Needs: Not on file  Physical Activity: Not on file  Stress: Not on file  Social Connections: Not on file   No family history on file. No Known Allergies Prior to Admission medications   Medication Sig Start Date End Date Taking? Authorizing Provider  ibuprofen (ADVIL,MOTRIN) 200 MG tablet Take 200-400 mg by mouth every 4 (four) hours as needed for fever.    [provider]   DG Hand Complete Left  Result Date: 01/04/2023 CLINICAL DATA:  Animal bite EXAM: LEFT HAND - COMPLETE 3+ VIEW COMPARISON:  None Available. FINDINGS: Frontal, oblique, and lateral views of the left hand are obtained. No fracture, subluxation, or dislocation. Joint spaces are well preserved. Soft tissues are unremarkable. No radiopaque foreign body. IMPRESSION: 1. No fracture or  radiopaque foreign body. Electronically Signed   By: Randa Ngo M.D.   On: 01/04/2023 19:57    Positive ROS: All other systems have been reviewed and were otherwise negative with the exception of those mentioned in the HPI and as above.  Physical Exam: Vitals: Refer to EMR.  Temp 98.2 Constitutional:  WD, WN, NAD HEENT:  NCAT, EOMI Neuro/Psych:  Alert & oriented to person, place, and time; appropriate mood & affect Lymphatic: No generalized extremity edema or lymphadenopathy Extremities / MSK:  The extremities are normal with respect to appearance, ranges of motion, joint stability, muscle strength/tone, sensation, & perfusion except as otherwise noted:  There is a small puncture on the volar radial surface of the index finger, with some mild surrounding redness and swelling.  It is between the MP and PIP joints and location.  There is also more linear break in the skin on the dorsum with lesser local signs.  She is able to fully actively extend all the digits including the index finger.  There is mild swelling about the volar wound, but no significant edema at the MP level or proximal, and none distal to the PIP joint.  With the MP joint held flexed, passive ranging of the PIP and DIP joints does not provoke a painful response.  No expressible drainage  WBC 7.1  Assessment: Left index finger cat bite wounds, without definitive evidence for suppurative flexor tenosynovitis, but  may be forming small subcutaneous abscess at site of punctures, particularly volarly  Recommendations: I discussed this patient's present status and these recommendations with the patient's EDP, PA Deatra Canter.  I recommended:  1.  Perform bedside I&D of cat bite puncture wounds under digital block in order to decrease any infective load at the bite site  2.  Arrange for admission for management of the evolving infection, including parenteral antibiotic administration  3.  Keep the patient n.p.o. after midnight and off  of pharmacologic anticoagulation until the need to add surgical care to her treatment plan is fully excluded.  I plan to reevaluate her tomorrow for early response to this treatment plan.  4. Orders left for scheduled NSAID & Tylenol, NPO after MN, and SCDs  Akia Desroches A. Grandville Silos, [address] [place], [place]  [id number] Office: [phone] Mobile: [phone]  01/04/2023, 9:28 PM

## 2023-01-05 ENCOUNTER — Encounter (HOSPITAL_COMMUNITY): Payer: Self-pay | Admitting: Family Medicine

## 2023-01-05 DIAGNOSIS — E876 Hypokalemia: Secondary | ICD-10-CM

## 2023-01-05 DIAGNOSIS — S61459A Open bite of unspecified hand, initial encounter: Secondary | ICD-10-CM | POA: Diagnosis present

## 2023-01-05 LAB — BASIC METABOLIC PANEL
Anion gap: 8 (ref 5–15)
BUN: 9 mg/dL (ref 6–20)
CO2: 24 mmol/L (ref 22–32)
Calcium: 8.9 mg/dL (ref 8.9–10.3)
Chloride: 106 mmol/L (ref 98–111)
Creatinine, Ser: 0.69 mg/dL (ref 0.44–1.00)
GFR, Estimated: >60 mL/min (ref >60)
Glucose, Bld: 120 mg/dL — ABNORMAL HIGH (ref 70–99)
Potassium: 3.4 mmol/L — ABNORMAL LOW (ref 3.5–5.1)
Sodium: 138 mmol/L (ref 135–145)

## 2023-01-05 LAB — CBC
HCT: 43.3 % (ref 36.0–46.0)
Hemoglobin: 14.9 g/dL (ref 12.0–15.0)
MCH: 31.1 pg (ref 26.0–34.0)
MCHC: 34.4 g/dL (ref 30.0–36.0)
MCV: 90.4 fL (ref 80.0–100.0)
Platelets: 333 10*3/uL (ref 150–400)
RBC: 4.79 MIL/uL (ref 3.87–5.11)
RDW: 12.3 % (ref 11.5–15.5)
WBC: 8.3 10*3/uL (ref 4.0–10.5)
nRBC: 0 % (ref 0.0–0.2)

## 2023-01-05 LAB — MAGNESIUM: Magnesium: 2 mg/dL (ref 1.7–2.4)

## 2023-01-05 LAB — HIV ANTIBODY (ROUTINE TESTING W REFLEX): HIV Screen 4th Generation wRfx: NONREACTIVE

## 2023-01-05 MED ORDER — POTASSIUM CHLORIDE 10 MEQ/100ML IV SOLN
10.0000 meq | INTRAVENOUS | Status: AC
  Administered 2023-01-05 (×3): 10 meq via INTRAVENOUS
  Filled 2023-01-05 (×3): qty 100

## 2023-01-05 MED ORDER — POTASSIUM CHLORIDE 10 MEQ/100ML IV SOLN
INTRAVENOUS | Status: AC
  Administered 2023-01-05: 10 meq via INTRAVENOUS
  Filled 2023-01-05: qty 100

## 2023-01-05 NOTE — Progress Notes (Signed)
SCD placed per order. 

## 2023-01-05 NOTE — Progress Notes (Signed)
[facility] PROGRESS NOTE   Lindsay Hickman UTM:[id number] DOB: 2001/01/27 DOA: 01/04/2023  PCP: Patient, No Pcp Per  Brief History/Interval Summary: 22 y.o. female with no significant medical history presenting with cat bite.  Concern for hand infection.  She was hospitalized for IV antibiotics.  Consultants: Dr. Grandville Silos with hand surgery  Procedures: Incision and drainage in the emergency department    Subjective/Interval History: Patient mentions that her hand does feel better this morning but still having pain and limited mobility.  Denies any other complaints at this time.    Assessment/Plan:  Cat bite to left hand Hand surgery is following.  Patient currently on Unasyn.  Underwent incision and drainage by ED provider. Noted to have some swelling and erythema but patient mentions that this is improving.  Noted to be afebrile.  WBC is normal. Further management per hand surgery.  Hypokalemia Will be repleted.  Obesity Estimated body mass index is 30.48 kg/m as calculated from the following:   Height as of this encounter: 5\' 2"  (1.575 m).   Weight as of this encounter: 75.6 kg.   DVT Prophylaxis: SCDs Code Status: Full code Family Communication: Discussed with patient Disposition Plan: Hopefully return home when improved  Status is: Observation The patient remains OBS appropriate and may d/c before 2 midnights.      Medications: Scheduled:  acetaminophen  650 mg Oral Q6H   ibuprofen  600 mg Oral Q6H   Continuous:  ampicillin-sulbactam (UNASYN) IV Stopped (01/05/23 0131)   potassium chloride 10 mEq (01/05/23 0959)   FKC:[id number], morphine injection, ondansetron **OR** ondansetron (ZOFRAN) IV  Antibiotics: Anti-infectives (From admission, onward)    Start     Dose/Rate Route Frequency Ordered Stop   01/05/23 1000  vancomycin (VANCOREADY) IVPB 750 mg/150 mL  Status:  Discontinued        750 mg 150 mL/hr over  60 Minutes Intravenous Every 12 hours 01/04/23 2108 01/04/23 2230   01/05/23 0600  piperacillin-tazobactam (ZOSYN) IVPB 3.375 g  Status:  Discontinued        3.375 g 12.5 mL/hr over 240 Minutes Intravenous Every 8 hours 01/04/23 2108 01/04/23 2230   01/05/23 0200  Ampicillin-Sulbactam (UNASYN) 3 g in sodium chloride 0.9 % 100 mL IVPB        3 g 200 mL/hr over 30 Minutes Intravenous Every 6 hours 01/04/23 2234     01/04/23 1915  piperacillin-tazobactam (ZOSYN) IVPB 3.375 g        3.375 g 100 mL/hr over 30 Minutes Intravenous  Once 01/04/23 1905 01/04/23 2025   01/04/23 1915  vancomycin (VANCOREADY) IVPB 1250 mg/250 mL        1,250 mg 166.7 mL/hr over 90 Minutes Intravenous  Once 01/04/23 1908 01/04/23 2143       Objective:  Vital Signs  Vitals:   01/04/23 2246 01/04/23 2333 01/05/23 0435 01/05/23 0758  BP:  115/73 (!) 91/59 115/73  Pulse:  71 76 (!) 55  Resp:  16 19 18   Temp: 98.4 F (36.9 C) 98.2 F (36.8 C) 98.2 F (36.8 C) 97.8 F (36.6 C)  TempSrc: Oral Oral Oral Oral  SpO2:  100% 100% 100%  Weight:  75.6 kg    Height:  5\' 2"  (1.575 m)      Intake/Output Summary (Last 24 hours) at 01/05/2023 1103 Last data filed at 01/05/2023 0622 Gross per 24 hour  Intake 357.74 ml  Output --  Net 357.74 ml   [employer]   01/04/23 2333  Weight: 75.6 kg    General appearance: Awake alert.  In no distress Resp: Clear to auscultation bilaterally.  Normal effort Cardio: S1-S2 is normal regular.  No S3-S4.  No rubs murmurs or bruit GI: Abdomen is soft.  Nontender nondistended.  Bowel sounds are present normal.  No masses organomegaly Extremities: Swelling of the left hand noted around the index finger.  Mild erythema and warmth noted as well. Neurologic: Alert and oriented x3.  No focal neurological deficits.    Lab Results:  Data Reviewed: I have personally reviewed following labs and reports of the imaging studies  CBC: Recent Labs  Lab 01/04/23 1957 01/05/23 0222   WBC 7.1 8.3  NEUTROABS 5.0  --   HGB 15.3* 14.9  HCT 43.7 43.3  MCV 89.7 90.4  PLT 293 572    Basic Metabolic Panel: Recent Labs  Lab 01/04/23 1957 01/05/23 0222  NA 137 138  K 3.8 3.4*  CL 106 106  CO2 20* 24  GLUCOSE 98 120*  BUN 7 9  CREATININE 0.63 0.69  CALCIUM 9.3 8.9    GFR: Estimated Creatinine Clearance: 105.9 mL/min (by C-G formula based on SCr of 0.69 mg/dL).    Radiology Studies: DG Hand Complete Left  Result Date: 01/04/2023 CLINICAL DATA:  Animal bite EXAM: LEFT HAND - COMPLETE 3+ VIEW COMPARISON:  None Available. FINDINGS: Frontal, oblique, and lateral views of the left hand are obtained. No fracture, subluxation, or dislocation. Joint spaces are well preserved. Soft tissues are unremarkable. No radiopaque foreign body. IMPRESSION: 1. No fracture or radiopaque foreign body. Electronically Signed   By: Randa Ngo M.D.   On: 01/04/2023 19:57       LOS: 0 days   [facility] Pager on [email or web address]  01/05/2023, 11:03 AM

## 2023-01-05 NOTE — Progress Notes (Addendum)
  PATIENT ID: Lindsay Hickman  MRN: [id number]  DOB/AGE:  May 25, 2001 / 22 y.o.      Subjective: Feeling better this morning, slight decrease in pain, patient has noticed decreased intensity redness and slightly easier mobility   Objective: Afebrile, WBC 8.3  Noted improvement with relation to erythema and swelling.  Still tender about the volar surface of the proximal phalangeal region, less so in the distal aspect of the palm.  Actively, full extension of the digit is possible without help, and actively can flex to the fingertip within 2 cm of the palm  Assessment/Plan: Seems to be improving with bedside EDP I&D of bite wounds and parenteral antibiotics.  Will plan to provide diet today, and keep n.p.o. after midnight tonight for surgical reevaluation in the morning.   Rayvon Char Grandville Silos, [place], [place]  [address] Office: [phone] Mobile: [phone]  01/05/2023, 11:36 AM

## 2023-01-05 NOTE — Progress Notes (Signed)
Pharmacy Antibiotic Note  Lindsay Hickman is a 22 y.o. female admitted on 01/04/2023 with  infected cat bite .  Pharmacy has been consulted for Unasyn dosing. SCr 0.69 and at  baseline. CrCl 105 ml/min. WBC 8.3 and afebrile. Antibiotics narrowed from vancomycin and Zosyn to Unasyn.  Plan: Unasyn 3 grams IV q6h Monitor signs/symptoms of infection and ortho recs  Height: 5\' 2"  (157.5 cm) Weight: 75.6 kg (166 lb 10.7 oz) IBW/kg (Calculated) : 50.1  Temp (24hrs), Avg:98.1 F (36.7 C), Min:97.8 F (36.6 C), Max:98.4 F (36.9 C)  Recent Labs  Lab 01/04/23 1957 01/05/23 0222  WBC 7.1 8.3  CREATININE 0.63 0.69    Estimated Creatinine Clearance: 105.9 mL/min (by C-G formula based on SCr of 0.69 mg/dL).    No Known Allergies  Antimicrobials this admission: Unasyn 2/3 >>  Vancomycin 2/2 Zosyn 2/2  Dose adjustments this admission:   Microbiology results:  Thank you for allowing pharmacy to be a part of this patient's care.  Jeneen Rinks 7/0/2637 85:88 PM

## 2023-01-06 LAB — BASIC METABOLIC PANEL
Anion gap: 7 (ref 5–15)
BUN: 7 mg/dL (ref 6–20)
CO2: 23 mmol/L (ref 22–32)
Calcium: 8.6 mg/dL — ABNORMAL LOW (ref 8.9–10.3)
Chloride: 106 mmol/L (ref 98–111)
Creatinine, Ser: 0.65 mg/dL (ref 0.44–1.00)
GFR, Estimated: >60 mL/min (ref >60)
Glucose, Bld: 107 mg/dL — ABNORMAL HIGH (ref 70–99)
Potassium: 3.6 mmol/L (ref 3.5–5.1)
Sodium: 136 mmol/L (ref 135–145)

## 2023-01-06 LAB — CBC
HCT: 41.7 % (ref 36.0–46.0)
Hemoglobin: 14.2 g/dL (ref 12.0–15.0)
MCH: 30.7 pg (ref 26.0–34.0)
MCHC: 34.1 g/dL (ref 30.0–36.0)
MCV: 90.1 fL (ref 80.0–100.0)
Platelets: 313 10*3/uL (ref 150–400)
RBC: 4.63 MIL/uL (ref 3.87–5.11)
RDW: 12.1 % (ref 11.5–15.5)
WBC: 5.5 10*3/uL (ref 4.0–10.5)
nRBC: 0 % (ref 0.0–0.2)

## 2023-01-06 LAB — MAGNESIUM: Magnesium: 2 mg/dL (ref 1.7–2.4)

## 2023-01-06 MED ORDER — HYDROCODONE-ACETAMINOPHEN 5-325 MG PO TABS: 1.0000 | ORAL_TABLET | Freq: Four times a day (QID) | ORAL | 0 refills | Status: AC | PRN

## 2023-01-06 MED ORDER — AMOXICILLIN-POT CLAVULANATE 875-125 MG PO TABS: 1.0000 | ORAL_TABLET | Freq: Two times a day (BID) | ORAL | Status: DC

## 2023-01-06 MED ORDER — AMOXICILLIN-POT CLAVULANATE 875-125 MG PO TABS: 1.0000 | ORAL_TABLET | Freq: Two times a day (BID) | ORAL | 0 refills | Status: AC

## 2023-01-06 NOTE — Care Management (Signed)
Added resources to sign up for health insurance to AVS. MATCH does not cover narcotics. Will conserve MATCH for future use, generic antibiotic can be paid for out of pocket.

## 2023-01-06 NOTE — Discharge Summary (Signed)
[facility]  Physician Discharge Summary   Patient ID: Lindsay Hickman MRN: [id number] DOB/AGE: 2001-05-11 22 y.o.  Admit date: 01/04/2023 Discharge date: 01/06/2023    PCP: Patient, No Pcp Per  DISCHARGE DIAGNOSES:    Cat bite of left hand with resultant cellulitis   RECOMMENDATIONS FOR OUTPATIENT FOLLOW UP: Patient to follow-up with Dr. Grandville Silos with hand surgery in the outpatient setting   CODE STATUS: Full code  DISCHARGE CONDITION: fair  Diet recommendation: As before  INITIAL HISTORY:  22 y.o. female with no significant medical history presenting with cat bite.  Concern for hand infection.  She was hospitalized for IV antibiotics.   Consultants: Dr. Grandville Silos with hand surgery   Procedures: Incision and drainage in the emergency department   HOSPITAL COURSE:   Cat bite to left hand Patient was seen by hand surgery.  Underwent incision and drainage by ED provider.  Patient was started on Unasyn.  Swelling and erythema continues to improve.  Cleared by hand surgery for discharge.  Will discharge her on Augmentin.  WBC has been normal.  She is afebrile.  Mobility of the hand has improved.   Hypokalemia Repleted.   Obesity Estimated body mass index is 30.48 kg/m as calculated from the following:   Height as of this encounter: 5\' 2"  (1.575 m).   Weight as of this encounter: 75.6 kg.  Patient stable.  Improved.  Okay for discharge home today.   PERTINENT LABS:  The results of significant diagnostics from this hospitalization (including imaging, microbiology, ancillary and laboratory) are listed below for reference.     Labs:   Basic Metabolic Panel: Recent Labs  Lab 01/04/23 1957 01/05/23 0220 01/05/23 0222 01/06/23 0212  NA 137  --  138 136  K 3.8  --  3.4* 3.6  CL 106  --  106 106  CO2 20*  --  24 23  GLUCOSE 98  --  120* 107*  BUN 7  --  9 7  CREATININE 0.63  --  0.69 0.65  CALCIUM 9.3  --  8.9 8.6*  MG  --  2.0  --   2.0   CBC: Recent Labs  Lab 01/04/23 1957 01/05/23 0222 01/06/23 0212  WBC 7.1 8.3 5.5  NEUTROABS 5.0  --   --   HGB 15.3* 14.9 14.2  HCT 43.7 43.3 41.7  MCV 89.7 90.4 90.1  PLT 293 333 313     IMAGING STUDIES DG Hand Complete Left  Result Date: 01/04/2023 CLINICAL DATA:  Animal bite EXAM: LEFT HAND - COMPLETE 3+ VIEW COMPARISON:  None Available. FINDINGS: Frontal, oblique, and lateral views of the left hand are obtained. No fracture, subluxation, or dislocation. Joint spaces are well preserved. Soft tissues are unremarkable. No radiopaque foreign body. IMPRESSION: 1. No fracture or radiopaque foreign body. Electronically Signed   By: Randa Ngo M.D.   On: 01/04/2023 19:57    DISCHARGE EXAMINATION: Vitals:   01/05/23 1601 01/05/23 1909 01/06/23 0406 01/06/23 0752  BP: 112/73 108/61 101/65 (!) 121/93  Pulse: 75 63 66 68  Resp: 18 16 16 17   Temp: 97.9 F (36.6 C) 98.3 F (36.8 C) 98.2 F (36.8 C) 97.8 F (36.6 C)  TempSrc: Oral Oral Oral   SpO2: 100% 98% 99% 100%  Weight:      Height:       General appearance: Awake alert.  In no distress Resp: Clear to auscultation bilaterally.  Normal effort Cardio: S1-S2 is normal regular.  No S3-S4.  No rubs murmurs or bruit GI: Abdomen is soft.  Nontender nondistended.  Bowel sounds are present normal.  No masses organomegaly   DISPOSITION: Home  Discharge Instructions     Call MD for:  difficulty breathing, headache or visual disturbances   Complete by: As directed    Call MD for:  extreme fatigue   Complete by: As directed    Call MD for:  persistant dizziness or light-headedness   Complete by: As directed    Call MD for:  persistant nausea and vomiting   Complete by: As directed    Call MD for:  redness, tenderness, or signs of infection (pain, swelling, redness, odor or green/yellow discharge around incision site)   Complete by: As directed    Call MD for:  severe uncontrolled pain   Complete by: As directed     Call MD for:  temperature >100.4   Complete by: As directed    Diet - low sodium heart healthy   Complete by: As directed    Discharge instructions   Complete by: As directed    Please follow instructions provided by Dr. Grandville Silos.  Follow-up with him in his office if your symptoms worsen or if the hand does not improve.  Take antibiotics as prescribed.  You were cared for by a hospitalist during your hospital stay. If you have any questions about your discharge medications or the care you received while you were in the hospital after you are discharged, you can call the unit and asked to speak with the hospitalist on call if the hospitalist that took care of you is not available. Once you are discharged, your primary care physician will handle any further medical issues. Please note that NO REFILLS for any discharge medications will be authorized once you are discharged, as it is imperative that you return to your primary care physician (or establish a relationship with a primary care physician if you do not have one) for your aftercare needs so that they can reassess your need for medications and monitor your lab values. If you do not have a primary care physician, you can call [phone] for a physician referral.   Increase activity slowly   Complete by: As directed           Allergies as of 01/06/2023   No Known Allergies      Medication List     TAKE these medications    acetaminophen 500 MG tablet Commonly known as: TYLENOL Take 500-1,000 mg by mouth every 6 (six) hours as needed for moderate pain or mild pain.   amoxicillin-clavulanate 875-125 MG tablet Commonly known as: AUGMENTIN Take 1 tablet by mouth every 12 (twelve) hours for 8 days.   HYDROcodone-acetaminophen 5-325 MG tablet Commonly known as: NORCO/VICODIN Take 1 tablet by mouth every 6 (six) hours as needed for severe pain.   ibuprofen 200 MG tablet Commonly known as: ADVIL Take 200-400 mg by mouth every 4 (four)  hours as needed for fever.          Follow-up Information     Milly Jakob, MD Follow up.   Specialty: Orthopedic Surgery Why: As needed Contact information: [place] [address] [phone]         [email or web address] Follow up.   Why: Website to sign up for health insurance                TOTAL DISCHARGE TIME: 35 minutes  Jerimy Johanson Tax adviser on [email or web address]  01/07/2023, 10:42 AM

## 2023-01-06 NOTE — Progress Notes (Signed)
  PATIENT ID: Lindsay Hickman  MRN: [id number]  DOB/AGE:  2001-10-27 / 22 y.o.      Subjective: Feeling very better this morning, continued decrease in pain, and much easier movement  Objective: Afebrile, WBC 5.5  Still mildly tender about the volar surface of the proximal phalangeal region, but significantly less edema/tightness.  Actively, full extension of the digit is possible without help, and actively can flex to the fingertip within 2 cm of the palm and movement is quicker and easier than before.  Assessment/Plan: Appears improved and likely to resolve without surgical care.  Probably can change later today to PO antibiotics and d/c home with instructions to seek re-eval if starts to worsen on PO antimicrobials.  I have instructed in ROM ex and seeking re-eval with me if normal ROM not achieved within a few days. Will sign-off.    Rayvon Char Grandville Silos, [place] [place], [place]  [address] Office: [phone] Mobile: [phone]  01/06/2023, 7:50 AM

## 2023-01-06 NOTE — Discharge Instructions (Addendum)
Please quarantine your cat and monitor for changes in behavior. Seek attention if such changes are noted as they could be suggestive of rabies.
